# Patient Record
Sex: Male | Born: 1990 | Hispanic: No | Marital: Single | Smoking: Current some day smoker
Health system: Southern US, Community
[De-identification: ages and names within clinical notes are randomized; demographics above are authoritative.]

## PROBLEM LIST (undated history)

## (undated) DIAGNOSIS — R072 Precordial pain: Secondary | ICD-10-CM

## (undated) DIAGNOSIS — I10 Essential (primary) hypertension: Secondary | ICD-10-CM

## (undated) DIAGNOSIS — R002 Palpitations: Secondary | ICD-10-CM

## (undated) DIAGNOSIS — B349 Viral infection, unspecified: Secondary | ICD-10-CM

## (undated) DIAGNOSIS — E119 Type 2 diabetes mellitus without complications: Secondary | ICD-10-CM

## (undated) DIAGNOSIS — E785 Hyperlipidemia, unspecified: Secondary | ICD-10-CM

## (undated) HISTORY — DX: Palpitations: R00.2

## (undated) HISTORY — DX: Hyperlipidemia, unspecified: E78.5

## (undated) HISTORY — DX: Viral infection, unspecified: B34.9

## (undated) HISTORY — DX: Precordial pain: R07.2

## (undated) HISTORY — DX: Type 2 diabetes mellitus without complications: E11.9

---
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
## 2012-02-13 ENCOUNTER — Emergency Department (HOSPITAL_COMMUNITY)
Admission: EM | Admit: 2012-02-13 | Discharge: 2012-02-13 | Disposition: A | Discharge status: Home / Self Care | Payer: Self-pay | Attending: Emergency Medicine | Admitting: Emergency Medicine

## 2012-02-13 ENCOUNTER — Encounter (HOSPITAL_COMMUNITY): Payer: Self-pay | Admitting: *Deleted

## 2012-02-13 DIAGNOSIS — R630 Anorexia: Secondary | ICD-10-CM | POA: Insufficient documentation

## 2012-02-13 DIAGNOSIS — R51 Headache: Secondary | ICD-10-CM | POA: Insufficient documentation

## 2012-02-13 DIAGNOSIS — J3489 Other specified disorders of nose and nasal sinuses: Secondary | ICD-10-CM | POA: Insufficient documentation

## 2012-02-13 DIAGNOSIS — B349 Viral infection, unspecified: Secondary | ICD-10-CM

## 2012-02-13 DIAGNOSIS — B9789 Other viral agents as the cause of diseases classified elsewhere: Secondary | ICD-10-CM | POA: Insufficient documentation

## 2012-02-13 DIAGNOSIS — IMO0001 Reserved for inherently not codable concepts without codable children: Secondary | ICD-10-CM | POA: Insufficient documentation

## 2012-02-13 DIAGNOSIS — J029 Acute pharyngitis, unspecified: Secondary | ICD-10-CM | POA: Insufficient documentation

## 2012-02-13 MED ORDER — IBUPROFEN 800 MG PO TABS
800.0000 mg | ORAL_TABLET | Freq: Once | ORAL | Status: AC
  Administered 2012-02-13: 800 mg via ORAL
  Filled 2012-02-13: qty 1

## 2012-02-13 MED ORDER — IBUPROFEN 800 MG PO TABS: 800.0000 mg | ORAL_TABLET | Freq: Three times a day (TID) | ORAL | Status: DC | PRN

## 2012-02-13 NOTE — ED Notes (Signed)
Pt drinking water at this time.

## 2012-02-13 NOTE — ED Provider Notes (Signed)
History     CSN: [id number]  Arrival date & time 02/13/12  2040   First MD Initiated Contact with Patient 02/13/12 2105      Chief Complaint  Patient presents with  . Influenza    (Consider location/radiation/quality/duration/timing/severity/associated sxs/prior treatment) HPI Comments: Patient states he woke this morning.  Not feeling well complaining of his legs aching, sore throat, and a right knee.  Nose.  This progressed into a tactile fever.  His mother gave him some kind of an over-the-counter.  Multisymptom tablet.  He, states he slept for 4 or 5 hours and woke feeling, just.  He denies any change in his ability to eat, but has no appetite.  Denies any nausea, vomiting, diarrhea, has no known ill contacts  Patient is a 22 y.o. male presenting with flu symptoms. The history is provided by the patient.  Influenza Presenting symptoms: fever, headache, myalgias, rhinorrhea and sore throat   Presenting symptoms: no cough, no diarrhea, no fatigue, no nausea, no shortness of breath and no vomiting   Severity:  Moderate Onset quality:  Gradual Duration:  1 day Progression:  Unchanged Chronicity:  New Relieved by:  Nothing Ineffective treatments:  OTC medications Associated symptoms: decreased appetite and nasal congestion   Associated symptoms: no chills, no ear pain and no neck stiffness     History reviewed. No pertinent past medical history.  History reviewed. No pertinent past surgical history.  No family history on file.  History  Substance Use Topics  . Smoking status: Never Smoker   . Smokeless tobacco: Not on file  . Alcohol Use: No      Review of Systems  Constitutional: Positive for fever and decreased appetite. Negative for chills and fatigue.  HENT: Positive for congestion, sore throat, rhinorrhea and sinus pressure. Negative for ear pain, trouble swallowing and neck stiffness.   Respiratory: Negative for cough and shortness of breath.   Cardiovascular:  Negative.   Gastrointestinal: Negative for nausea, vomiting and diarrhea.  Genitourinary: Negative for dysuria.  Musculoskeletal: Positive for myalgias.  Neurological: Positive for headaches.  All other systems reviewed and are negative.    Allergies  Review of patient's allergies indicates no known allergies.  Home Medications   Current Outpatient Rx  Name  Route  Sig  Dispense  Refill  . ibuprofen (ADVIL,MOTRIN) 800 MG tablet   Oral   Take 1 tablet (800 mg total) by mouth every 8 (eight) hours as needed for pain.   30 tablet   0     BP 135/63  Pulse 110  Temp(Src) 99 F (37.2 C) (Oral)  Resp 20  SpO2 96%  Physical Exam  Constitutional: He is oriented to person, place, and time. He appears well-developed and well-nourished. No distress.  HENT:  Head: Normocephalic and atraumatic.  Right Ear: External ear normal.  Left Ear: External ear normal.  Nose: Nose normal.  Mouth/Throat: Oropharynx is clear and moist. No oropharyngeal exudate.  Eyes: Pupils are equal, round, and reactive to light.  Neck: Normal range of motion.  Cardiovascular: Regular rhythm.  Tachycardia present.   Pulmonary/Chest: Effort normal and breath sounds normal.  Abdominal: Soft. Bowel sounds are normal. He exhibits no distension. There is no tenderness.  Musculoskeletal: Normal range of motion. He exhibits no edema and no tenderness.  Lymphadenopathy:    He has no cervical adenopathy.  Neurological: He is alert and oriented to person, place, and time.  Skin: Skin is warm and dry. No rash noted.  ED Course  Procedures (including critical care time)  Labs Reviewed  RAPID STREP SCREEN   No results found.   1. Viral syndrome       MDM          Arman Filter, NP 02/14/12 2021

## 2012-02-13 NOTE — ED Notes (Signed)
PT reports flu Sx's that started this AM. Pt reports generalize aches ,sore throat and fever. Pt took OTC cold earlier to day.

## 2012-02-15 NOTE — ED Provider Notes (Signed)
Medical screening examination/treatment/procedure(s) were performed by non-physician practitioner and as supervising physician I was immediately available for consultation/collaboration.   Joya Gaskins, MD 02/15/12 1051

## 2019-08-13 ENCOUNTER — Ambulatory Visit (HOSPITAL_COMMUNITY)
Admission: EM | Admit: 2019-08-13 | Discharge: 2019-08-13 | Disposition: A | Discharge status: Home / Self Care | Payer: HRSA Program | Attending: Family Medicine | Admitting: Family Medicine

## 2019-08-13 ENCOUNTER — Other Ambulatory Visit: Payer: Self-pay

## 2019-08-13 ENCOUNTER — Encounter (HOSPITAL_COMMUNITY): Payer: Self-pay

## 2019-08-13 DIAGNOSIS — R002 Palpitations: Secondary | ICD-10-CM | POA: Diagnosis not present

## 2019-08-13 DIAGNOSIS — Z791 Long term (current) use of non-steroidal anti-inflammatories (NSAID): Secondary | ICD-10-CM | POA: Insufficient documentation

## 2019-08-13 DIAGNOSIS — U071 COVID-19: Secondary | ICD-10-CM | POA: Diagnosis not present

## 2019-08-13 DIAGNOSIS — R509 Fever, unspecified: Secondary | ICD-10-CM | POA: Diagnosis present

## 2019-08-13 DIAGNOSIS — B349 Viral infection, unspecified: Secondary | ICD-10-CM

## 2019-08-13 LAB — SARS CORONAVIRUS 2 (TAT 6-24 HRS): SARS Coronavirus 2: POSITIVE — AB

## 2019-08-13 NOTE — ED Triage Notes (Signed)
Pt c/o general malaise, restlessness, subjective fever, "fast heart rate" intermittently, even at rest, decreased appetite, change in taste.  Pt denies CP/bakc/arm/jaw pain, SOB, cough, congestion, runny nose, abdominal pain, n/v/d, dizziness. Took tylenol this morning at 0700 (3 tabs).  Pt states he lost his sense of taste/smell in February and hasn't been able to fully taste/smell since then. Did not have COVID test at that time.

## 2019-08-13 NOTE — Discharge Instructions (Signed)
Rest. Stay hydrated OTC medicines as needed.  Follow up as needed for continued or worsening symptoms Covid test pending.

## 2019-08-13 NOTE — ED Provider Notes (Signed)
[facility]    CSN: [id number] Arrival date & time: 08/13/19  1443      History   Chief Complaint Chief Complaint  Patient presents with  . Fever  . Palpitations    HPI Travis Riggs is a 29 y.o. male.   Patient is a 29 year old male who presents today with generalized malaise, restlessness, subjective fever, decreased appetite and change in taste.  Reporting at times he has had some intermittent palpitations.  No chest pain or shortness of breath.  No cough, congestion, rhinorrhea, abdominal pain, nausea, vomiting or diarrhea.  Took Tylenol this morning for body aches and fever.  Reporting loss of sense of taste and smell in February and unsure if he had Covid at that time.  Has not been COVID vaccinated.  ROS per HPI      History reviewed. No pertinent past medical history.  There are no problems to display for this patient.   History reviewed. No pertinent surgical history.     Home Medications    Prior to Admission medications   Medication Sig Start Date End Date Taking? Authorizing Provider  ibuprofen (ADVIL,MOTRIN) 800 MG tablet Take 1 tablet (800 mg total) by mouth every 8 (eight) hours as needed for pain. 02/13/12   Earley Favor, NP    Family History Family History  Problem Relation Age of Onset  . Diabetes Mother   . Hyperlipidemia Mother     Social History Social History   Tobacco Use  . Smoking status: Never Smoker  . Smokeless tobacco: Never Used  Vaping Use  . Vaping Use: Never used  Substance Use Topics  . Alcohol use: No  . Drug use: Yes    Types: Marijuana     Allergies   Patient has no known allergies.   Review of Systems Review of Systems   Physical Exam Triage Vital Signs ED Triage Vitals  Enc Vitals Group     BP 08/13/19 0912 (!) 140/92     Pulse Rate 08/13/19 0912 76     Resp 08/13/19 0912 17     Temp 08/13/19 0912 98.9 F (37.2 C)     Temp Source 08/13/19 0912 Oral     SpO2 08/13/19 0912 99 %      Weight --      Height --      Head Circumference --      Peak Flow --      Pain Score 08/13/19 0913 0     Pain Loc --      Pain Edu? --      Excl. in GC? --    No data found.  Updated Vital Signs BP (!) 140/92 (BP Location: Left Wrist)   Pulse 76   Temp 98.9 F (37.2 C) (Oral)   Resp 17   SpO2 99%   Visual Acuity Right Eye Distance:   Left Eye Distance:   Bilateral Distance:    Right Eye Near:   Left Eye Near:    Bilateral Near:     Physical Exam Vitals and nursing note reviewed.  Constitutional:      Appearance: Normal appearance.  HENT:     Head: Normocephalic and atraumatic.     Nose: Nose normal.  Eyes:     Conjunctiva/sclera: Conjunctivae normal.  Pulmonary:     Effort: Pulmonary effort is normal.  Musculoskeletal:        General: Normal range of motion.     Cervical back: Normal range of motion.  Skin:    General: Skin is warm and dry.  Neurological:     Mental Status: He is alert.  Psychiatric:        Mood and Affect: Mood normal.      UC Treatments / Results  Labs (all labs ordered are listed, but only abnormal results are displayed) Labs Reviewed  SARS CORONAVIRUS 2 (TAT 6-24 HRS) - Abnormal; Notable for the following components:      Result Value   SARS Coronavirus 2 POSITIVE (*)    All other components within normal limits    EKG   Radiology No results found.  Procedures Procedures (including critical care time)  Medications Ordered in UC Medications - No data to display  Initial Impression / Assessment and Plan / UC Course  I have reviewed the triage vital signs and the nursing notes.  Pertinent labs & imaging results that were available during my care of the patient were reviewed by me and considered in my medical decision making (see chart for details).     Viral illness High possibility for Covid.  Covid swab pending.  Recommended rest, stay hydrated and over-the-counter medicines as needed. Follow up as needed for  continued or worsening symptoms  Final Clinical Impressions(s) / UC Diagnoses   Final diagnoses:  Viral illness     Discharge Instructions     Rest. Stay hydrated OTC medicines as needed.  Follow up as needed for continued or worsening symptoms Covid test pending.     ED Prescriptions    None     PDMP not reviewed this encounter.   Janace Aris, NP 08/14/19 [id number]

## 2020-09-20 ENCOUNTER — Other Ambulatory Visit: Payer: Self-pay

## 2020-09-20 ENCOUNTER — Ambulatory Visit
Admission: EM | Admit: 2020-09-20 | Discharge: 2020-09-20 | Disposition: A | Discharge status: Home / Self Care | Payer: Self-pay | Attending: Emergency Medicine | Admitting: Emergency Medicine

## 2020-09-20 DIAGNOSIS — H60391 Other infective otitis externa, right ear: Secondary | ICD-10-CM

## 2020-09-20 MED ORDER — IBUPROFEN 800 MG PO TABS: 800.0000 mg | ORAL_TABLET | Freq: Three times a day (TID) | ORAL | 0 refills | Status: DC

## 2020-09-20 MED ORDER — AMOXICILLIN 500 MG PO CAPS: 1000.0000 mg | ORAL_CAPSULE | Freq: Two times a day (BID) | ORAL | 0 refills | Status: AC

## 2020-09-20 MED ORDER — NEOMYCIN-POLYMYXIN-HC 3.5-10000-1 OT SUSP: 3.0000 [drp] | Freq: Three times a day (TID) | OTIC | 0 refills | Status: AC

## 2020-09-20 NOTE — ED Triage Notes (Signed)
3 day h/o pulsating right ear pain. Last dose of tylenol taken at 7a today. Has used OTC swimmers ear drops with an increase in sxs. Denies cough.

## 2020-09-20 NOTE — Discharge Instructions (Signed)
Begin amoxicillin twice daily for 10 days Cortisporin eardrops 3 times daily x1 week Ibuprofen and Tylenol for pain Avoid moisture to ear Follow-up if not seeing any improvement with the above over the next 3 days

## 2020-09-20 NOTE — ED Provider Notes (Signed)
[facility]    CSN: [id number] Arrival date & time: 09/20/20  0907      History   Chief Complaint Chief Complaint  Patient presents with   Otalgia    right    HPI Travis Riggs is a 30 y.o. male presenting today for evaluation of right ear pain.  Right ear pain x3 days.  Use OTC swimmer's ear drops without relief.  Denies any recent URI symptoms.  HPI  History reviewed. No pertinent past medical history.  There are no problems to display for this patient.   History reviewed. No pertinent surgical history.     Home Medications    Prior to Admission medications   Medication Sig Start Date End Date Taking? Authorizing Provider  amoxicillin (AMOXIL) 500 MG capsule Take 2 capsules (1,000 mg total) by mouth 2 (two) times daily for 10 days. 09/20/20 09/30/20 Yes Cleona Doubleday C, PA-C  ibuprofen (ADVIL) 800 MG tablet Take 1 tablet (800 mg total) by mouth 3 (three) times daily. 09/20/20  Yes Adiana Smelcer C, PA-C  neomycin-polymyxin-hydrocortisone (CORTISPORIN) 3.5-10000-1 OTIC suspension Place 3 drops into the right ear 3 (three) times daily for 7 days. 09/20/20 09/27/20 Yes Kemar Pandit, Junius Creamer, PA-C    Family History Family History  Problem Relation Age of Onset   Diabetes Mother    Hyperlipidemia Mother     Social History Social History   Tobacco Use   Smoking status: Some Days    Types: Cigars   Smokeless tobacco: Never  Vaping Use   Vaping Use: Never used  Substance Use Topics   Alcohol use: No   Drug use: Yes    Types: Marijuana     Allergies   Patient has no known allergies.   Review of Systems Review of Systems  Constitutional:  Negative for activity change, appetite change, chills, fatigue and fever.  HENT:  Positive for ear pain. Negative for congestion, rhinorrhea, sinus pressure, sore throat and trouble swallowing.   Eyes:  Negative for discharge and redness.  Respiratory:  Negative for cough, chest tightness and shortness of  breath.   Cardiovascular:  Negative for chest pain.  Gastrointestinal:  Negative for abdominal pain, diarrhea, nausea and vomiting.  Musculoskeletal:  Negative for myalgias.  Skin:  Negative for rash.  Neurological:  Negative for dizziness, light-headedness and headaches.    Physical Exam Triage Vital Signs ED Triage Vitals  Enc Vitals Group     BP      Pulse      Resp      Temp      Temp src      SpO2      Weight      Height      Head Circumference      Peak Flow      Pain Score      Pain Loc      Pain Edu?      Excl. in GC?    No data found.  Updated Vital Signs BP 129/85 (BP Location: Left Arm)   Pulse 75   Temp 98.3 F (36.8 C) (Oral)   Resp 18   SpO2 98%   Visual Acuity Right Eye Distance:   Left Eye Distance:   Bilateral Distance:    Right Eye Near:   Left Eye Near:    Bilateral Near:     Physical Exam Vitals and nursing note reviewed.  Constitutional:      Appearance: He is well-developed.  Comments: No acute distress  HENT:     Head: Normocephalic and atraumatic.     Ears:     Comments: Right canal erythematous and edematous, TM partially visualized and appears intact, appears slightly irregular and erythematous    Nose: Nose normal.  Eyes:     Conjunctiva/sclera: Conjunctivae normal.  Cardiovascular:     Rate and Rhythm: Normal rate.  Pulmonary:     Effort: Pulmonary effort is normal. No respiratory distress.  Abdominal:     General: There is no distension.  Musculoskeletal:        General: Normal range of motion.     Cervical back: Neck supple.  Skin:    General: Skin is warm and dry.  Neurological:     Mental Status: He is alert and oriented to person, place, and time.     UC Treatments / Results  Labs (all labs ordered are listed, but only abnormal results are displayed) Labs Reviewed - No data to display  EKG   Radiology No results found.  Procedures Procedures (including critical care time)  Medications Ordered  in UC Medications - No data to display  Initial Impression / Assessment and Plan / UC Course  I have reviewed the triage vital signs and the nursing notes.  Pertinent labs & imaging results that were available during my care of the patient were reviewed by me and considered in my medical decision making (see chart for details).     Otitis externa/media on right-covering with amoxicillin and Cortisporin, anti-inflammatories for pain, monitor for gradual resolution.Discussed strict return precautions. Patient verbalized understanding and is agreeable with plan.  Final Clinical Impressions(s) / UC Diagnoses   Final diagnoses:  Infective otitis externa of right ear     Discharge Instructions      Begin amoxicillin twice daily for 10 days Cortisporin eardrops 3 times daily x1 week Ibuprofen and Tylenol for pain Avoid moisture to ear Follow-up if not seeing any improvement with the above over the next 3 days      ED Prescriptions     Medication Sig Dispense Auth. Provider   amoxicillin (AMOXIL) 500 MG capsule Take 2 capsules (1,000 mg total) by mouth 2 (two) times daily for 10 days. 40 capsule Johnni Wunschel C, PA-C   neomycin-polymyxin-hydrocortisone (CORTISPORIN) 3.5-10000-1 OTIC suspension Place 3 drops into the right ear 3 (three) times daily for 7 days. 10 mL Misaki Sozio C, PA-C   ibuprofen (ADVIL) 800 MG tablet Take 1 tablet (800 mg total) by mouth 3 (three) times daily. 21 tablet Sansa Alkema, [place] C, PA-C      PDMP not reviewed this encounter.   Lew Dawes, PA-C 09/20/20 1007

## 2021-02-02 ENCOUNTER — Emergency Department (HOSPITAL_BASED_OUTPATIENT_CLINIC_OR_DEPARTMENT_OTHER)
Admission: EM | Admit: 2021-02-02 | Discharge: 2021-02-02 | Disposition: A | Discharge status: Home / Self Care | Payer: Self-pay | Attending: Emergency Medicine | Admitting: Emergency Medicine

## 2021-02-02 ENCOUNTER — Emergency Department (HOSPITAL_BASED_OUTPATIENT_CLINIC_OR_DEPARTMENT_OTHER): Payer: Self-pay | Admitting: Radiology

## 2021-02-02 ENCOUNTER — Encounter (HOSPITAL_BASED_OUTPATIENT_CLINIC_OR_DEPARTMENT_OTHER): Payer: Self-pay | Admitting: Emergency Medicine

## 2021-02-02 ENCOUNTER — Other Ambulatory Visit: Payer: Self-pay

## 2021-02-02 DIAGNOSIS — R072 Precordial pain: Secondary | ICD-10-CM | POA: Insufficient documentation

## 2021-02-02 LAB — BASIC METABOLIC PANEL
Anion gap: 7 (ref 5–15)
BUN: 10 mg/dL (ref 6–20)
CO2: 25 mmol/L (ref 22–32)
Calcium: 9.2 mg/dL (ref 8.9–10.3)
Chloride: 105 mmol/L (ref 98–111)
Creatinine, Ser: 0.8 mg/dL (ref 0.61–1.24)
GFR, Estimated: >60 mL/min (ref >60)
Glucose, Bld: 103 mg/dL — ABNORMAL HIGH (ref 70–99)
Potassium: 3.7 mmol/L (ref 3.5–5.1)
Sodium: 137 mmol/L (ref 135–145)

## 2021-02-02 LAB — CBC
HCT: 42.7 % (ref 39.0–52.0)
Hemoglobin: 14.7 g/dL (ref 13.0–17.0)
MCH: 30.1 pg (ref 26.0–34.0)
MCHC: 34.4 g/dL (ref 30.0–36.0)
MCV: 87.3 fL (ref 80.0–100.0)
Platelets: 271 10*3/uL (ref 150–400)
RBC: 4.89 MIL/uL (ref 4.22–5.81)
RDW: 12.6 % (ref 11.5–15.5)
WBC: 8.8 10*3/uL (ref 4.0–10.5)
nRBC: 0 % (ref 0.0–0.2)

## 2021-02-02 LAB — TROPONIN I (HIGH SENSITIVITY): Troponin I (High Sensitivity): 4 ng/L (ref <18)

## 2021-02-02 MED ORDER — IBUPROFEN 600 MG PO TABS: 600.0000 mg | ORAL_TABLET | Freq: Four times a day (QID) | ORAL | 0 refills | Status: DC | PRN

## 2021-02-02 NOTE — Discharge Instructions (Signed)
Please read and follow all provided instructions.  Your diagnoses today include:  1. Precordial pain     Tests performed today include: An EKG of your heart A chest x-ray: Normal-appearing lungs and outline of the heart Cardiac enzymes - a blood test for heart muscle damage, does not show signs of stress on the heart or other problems Blood counts and electrolytes Vital signs. See below for your results today.   Medications prescribed:  Ibuprofen (Motrin, Advil) - anti-inflammatory pain medication Do not exceed 600mg  ibuprofen every 6 hours, take with food  You have been prescribed an anti-inflammatory medication or NSAID. Take with food. Take smallest effective dose for the shortest duration needed for your pain. Stop taking if you experience stomach pain or vomiting.   Take any prescribed medications only as directed.  Follow-up instructions: Please follow-up with your primary care provider as soon as you can for further evaluation of your symptoms.   Return instructions:  SEEK IMMEDIATE MEDICAL ATTENTION IF: You have severe chest pain, especially if the pain is crushing or pressure-like and spreads to the arms, back, neck, or jaw, or if you have sweating, nausea or vomiting, or trouble with breathing. THIS IS AN EMERGENCY. Do not wait to see if the pain will go away. Get medical help at once. Call 911. DO NOT drive yourself to the hospital.  Your chest pain gets worse and does not go away after a few minutes of rest.  You have an attack of chest pain lasting longer than what you usually experience.  You have significant dizziness, if you pass out, or have trouble walking.  You have chest pain not typical of your usual pain for which you originally saw your caregiver.  You have any other emergent concerns regarding your health.  Additional Information: Chest pain comes from many different causes. Your caregiver has diagnosed you as having chest pain that is not specific for one  problem, but does not require admission.  You are at low risk for an acute heart condition or other serious illness.   Your vital signs today were: BP 132/90    Pulse 62    Temp 99.1 F (37.3 C) (Temporal)    Resp 16    Ht 6' (1.829 m)    Wt (!) 145.2 kg    SpO2 100%    BMI 43.40 kg/m  If your blood pressure (BP) was elevated above 135/85 this visit, please have this repeated by your doctor within one month. --------------

## 2021-02-02 NOTE — ED Provider Notes (Signed)
[place] EMERGENCY DEPT Provider Note   CSN: [id number] Arrival date & time: 02/02/21  1209     History  Chief Complaint  Patient presents with   Chest Pain    Travis Riggs is a 31 y.o. male.  Patient presents to the emergency department for evaluation of left sided chest pain that has been ongoing for 4 days.  Patient describes the pain as a "soreness".  It does not radiate.  It is not worse with movement.  He feels that little bit more with deep breathing but denies cough, shortness of breath, fever.  He works in [employer] but denies any obvious injuries.  No nausea, vomiting, diarrhea.  Pain is constant.  Not associated with exertion.  No associated diaphoresis.  Denies history of hypertension, high cholesterol, diabetes but some of these run in his family.  No history of heart problems. Patient denies risk factors for pulmonary embolism including: unilateral leg swelling, history of DVT/PE/other blood clots, use of exogenous hormones, recent immobilizations, recent surgery, recent travel (>4hr segment), malignancy, hemoptysis.  Patient does endorse injecting a combination of B vitamins that he receives from [place].  He does this to help his energy level.      Home Medications Prior to Admission medications   Medication Sig Start Date End Date Taking? Authorizing Provider  ibuprofen (ADVIL) 800 MG tablet Take 1 tablet (800 mg total) by mouth 3 (three) times daily. 09/20/20   Wieters, Hallie C, PA-C      Allergies    Patient has no known allergies.    Review of Systems   Review of Systems  Physical Exam Updated Vital Signs BP 129/74    Pulse 66    Temp 99.1 F (37.3 C) (Temporal)    Resp 20    Ht 6' (1.829 m)    Wt (!) 145.2 kg    SpO2 99%    BMI 43.40 kg/m  Physical Exam Vitals and nursing note reviewed.  Constitutional:      Appearance: He is well-developed. He is not diaphoretic.  HENT:     Head: Normocephalic and atraumatic.     Mouth/Throat:      Mouth: Mucous membranes are not dry.  Eyes:     Conjunctiva/sclera: Conjunctivae normal.  Neck:     Vascular: Normal carotid pulses. No carotid bruit or JVD.     Trachea: Trachea normal. No tracheal deviation.  Cardiovascular:     Rate and Rhythm: Normal rate and regular rhythm.     Pulses: No decreased pulses.          Radial pulses are 2+ on the right side and 2+ on the left side.     Heart sounds: Normal heart sounds, S1 normal and S2 normal. Heart sounds not distant. No murmur heard. Pulmonary:     Effort: Pulmonary effort is normal. No respiratory distress.     Breath sounds: Normal breath sounds. No wheezing.  Chest:     Chest wall: No tenderness.     Comments: No tenderness to palpation over the left chest.  No skin findings. Abdominal:     General: Bowel sounds are normal.     Palpations: Abdomen is soft.     Tenderness: There is no abdominal tenderness. There is no guarding or rebound.  Musculoskeletal:     Cervical back: Normal range of motion and neck supple. No muscular tenderness.     Right lower leg: No edema.     Left lower leg: No edema.  Skin:    General: Skin is warm and dry.     Coloration: Skin is not pale.  Neurological:     Mental Status: He is alert. Mental status is at baseline.  Psychiatric:        Mood and Affect: Mood normal.    ED Results / Procedures / Treatments   Labs (all labs ordered are listed, but only abnormal results are displayed) Labs Reviewed  BASIC METABOLIC PANEL - Abnormal; Notable for the following components:      Result Value   Glucose, Bld 103 (*)    All other components within normal limits  CBC  TROPONIN I (HIGH SENSITIVITY)  TROPONIN I (HIGH SENSITIVITY)    ED ECG REPORT   Date: 02/02/2021  Rate: 66  Rhythm: normal sinus rhythm  QRS Axis: normal  Intervals: normal  ST/T Wave abnormalities: normal  Conduction Disutrbances:none  Narrative Interpretation:   Old EKG Reviewed: none available  I have personally  reviewed the EKG tracing and agree with the computerized printout as noted.   Radiology DG Chest 2 View  Result Date: 02/02/2021 CLINICAL DATA:  Chest pain EXAM: CHEST - 2 VIEW COMPARISON:  None. FINDINGS: The heart size and mediastinal contours are within normal limits. Both lungs are clear. The visualized skeletal structures are unremarkable. IMPRESSION: No active cardiopulmonary disease. Electronically Signed   By: Keane Police D.O.   On: 02/02/2021 12:50    Procedures Procedures    Medications Ordered in ED Medications - No data to display  ED Course/ Medical Decision Making/ A&P    Patient seen and examined. History obtained directly from patient. Work-up including labs, imaging, EKG ordered in triage, if performed, were reviewed.    Labs/EKG: Independently reviewed and interpreted.  This included: EKG showing normal sinus rhythm without ischemic findings.  CBC, BMP, troponin x1 pending.  Imaging: Independently reviewed and interpreted.  This included: Chest x-ray, agree normal.  Medications/Fluids: Ordered: None   Most recent vital signs reviewed and are as follows: BP 129/74    Pulse 66    Temp 99.1 F (37.3 C) (Temporal)    Resp 20    Ht 6' (1.829 m)    Wt (!) 145.2 kg    SpO2 99%    BMI 43.40 kg/m   Initial impression: Nonspecific chest pain, PERC negative.   3:45 PM Reassessment performed. Patient appears comfortable.   Labs and imaging personally reviewed and interpreted including: Normal troponin, CBC without abnormality, BMP with only mildly elevated glucose.  Reviewed additional pertinent lab work and imaging with patient at bedside including: Reassuring chest x-ray, EKG, labs  Most current vital signs reviewed and are as follows: BP 132/90    Pulse 62    Temp 99.1 F (37.3 C) (Temporal)    Resp 16    Ht 6' (1.829 m)    Wt (!) 145.2 kg    SpO2 100%    BMI 43.40 kg/m   Plan: Discharge to home  Home treatment: Prescription written for ibuprofen.   Return  and follow-up instructions: I encouraged patient to return to ED with severe chest pain, especially if the pain is crushing or pressure-like and spreads to the arms, back, neck, or jaw, or if they have associated sweating, vomiting, or shortness of breath with the pain, or significant pain with activity. We discussed that the evaluation here today indicates a low-risk of serious cause of chest pain, including heart trouble or a blood clot, but no evaluation is perfect and  chest pain can evolve with time. The patient verbalized understanding and agreed.  I encouraged patient to follow-up with their provider in the next 48 hours for recheck.    Encouraged patient to follow-up with their provider as needed. Patient verbalized understanding and agreed with plan.                                 Medical Decision Making Amount and/or Complexity of Data Reviewed Labs: ordered. Radiology: ordered.  Risk Prescription drug management.   For this patient's complaint of chest pain, the following emergent conditions were considered on the differential diagnosis: acute coronary syndrome, pulmonary embolism, pneumothorax, myocarditis, pericardial tamponade, aortic dissection, thoracic aortic aneurysm complication, esophageal perforation.   Other causes were also considered including: gastroesophageal reflux disease, musculoskeletal pain including costochondritis, pneumonia/pleurisy, herpes zoster, pericarditis.  In regards to possibility of ACS, patient has atypical features of pain, non-ischemic and unchanged EKG and negative troponin(s). Heart score was calculated to be 1.   In regards to possibility of PE, symptoms are atypical for PE and patient is PERC negative and chance of PE < 2%.   The patient's vital signs, pertinent lab work and imaging were reviewed and interpreted as discussed in the ED course. Hospitalization was considered for further testing, treatments, or serial exams/observation.  However as patient is well-appearing, has a stable exam, and reassuring studies today, I do not feel that they warrant admission at this time. This plan was discussed with the patient who verbalizes agreement and comfort with this plan and seems reliable and able to return to the Emergency Department with worsening or changing symptoms.          Final Clinical Impression(s) / ED Diagnoses Final diagnoses:  Precordial pain    Rx / DC Orders ED Discharge Orders          Ordered    ibuprofen (ADVIL) 600 MG tablet  Every 6 hours PRN        02/02/21 1544              Carlisle Cater, PA-C 02/02/21 1547    Luna Fuse, MD 02/04/21 [phone]

## 2021-02-02 NOTE — ED Triage Notes (Addendum)
Pt arrives to ED with c/o chest pain. The pain is left sided and started x5 days. The CP is constant and is described as soreness. Pt reports he does inject vitamins every week that he gets from [place], last injection was last Sunday.

## 2021-02-02 NOTE — ED Notes (Signed)
Patient verbalizes understanding of discharge instructions. Opportunity for questioning and answers were provided. Patient discharged from ED.  °

## 2021-10-27 ENCOUNTER — Other Ambulatory Visit: Payer: Self-pay

## 2021-10-27 ENCOUNTER — Ambulatory Visit
Admission: EM | Admit: 2021-10-27 | Discharge: 2021-10-27 | Disposition: A | Discharge status: Home / Self Care | Payer: Self-pay | Attending: Urgent Care | Admitting: Urgent Care

## 2021-10-27 DIAGNOSIS — R002 Palpitations: Secondary | ICD-10-CM

## 2021-10-27 DIAGNOSIS — R519 Headache, unspecified: Secondary | ICD-10-CM

## 2021-10-27 DIAGNOSIS — R0602 Shortness of breath: Secondary | ICD-10-CM

## 2021-10-27 DIAGNOSIS — J309 Allergic rhinitis, unspecified: Secondary | ICD-10-CM

## 2021-10-27 DIAGNOSIS — Z8249 Family history of ischemic heart disease and other diseases of the circulatory system: Secondary | ICD-10-CM

## 2021-10-27 DIAGNOSIS — I10 Essential (primary) hypertension: Secondary | ICD-10-CM

## 2021-10-27 MED ORDER — LISINOPRIL 10 MG PO TABS: 10.0000 mg | ORAL_TABLET | Freq: Every day | ORAL | 0 refills | Status: DC

## 2021-10-27 MED ORDER — CETIRIZINE HCL 10 MG PO TABS: 10.0000 mg | ORAL_TABLET | Freq: Every day | ORAL | 0 refills | Status: DC

## 2021-10-27 MED ORDER — FLUTICASONE PROPIONATE 50 MCG/ACT NA SUSP: 2.0000 | Freq: Every day | NASAL | 12 refills | Status: DC

## 2021-10-27 MED ORDER — PSEUDOEPHEDRINE HCL 30 MG PO TABS: 30.0000 mg | ORAL_TABLET | Freq: Three times a day (TID) | ORAL | 0 refills | Status: DC | PRN

## 2021-10-27 NOTE — ED Triage Notes (Signed)
Patient presents to UC for episodes of HA, hot flashes, heart palpitations x 2 weeks. He states HA is constant and heart palpitations last about 5-10 mins. Has noted episodes of exertion and SOB. Also reports feeling some tremors and some changes in vision when he has these episodes. Does not have a PCP. Concerned it may be related to HTN. Family hx of diabetes and HTN. Treating pain with tylenol.   Denies chest pain.

## 2021-10-27 NOTE — ED Provider Notes (Signed)
[facility]  Note:  This document was prepared using Conservation officer, historic buildings and may include unintentional dictation errors.  MRN: [id number] DOB: 07-18-1990  Subjective:   Travis Riggs is a 31 y.o. male presenting for 2 week history of persistent heart palpitations, hot flashes, heart racing.  Has multiple episodes a day and occurs with 5 to 10 minute episodes.  He occasionally has shortness of breath with exertion.  Has also had temporal headaches, right ear itching.  Wakes up every morning as long as he can remember with congestion.  Works in [employer].  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  No history of respiratory disorders.  Has concerns about high blood pressure, has extensive family history of the same.  No current facility-administered medications for this encounter.  Current Outpatient Medications:    ibuprofen (ADVIL) 600 MG tablet, Take 1 tablet (600 mg total) by mouth every 6 (six) hours as needed., Disp: 20 tablet, Rfl: 0   No Known Allergies  No past medical history on file.   No past surgical history on file.  Family History  Problem Relation Age of Onset   Diabetes Mother    Hyperlipidemia Mother     Social History   Tobacco Use   Smoking status: Some Days    Types: Cigars   Smokeless tobacco: Never  Vaping Use   Vaping Use: Never used  Substance Use Topics   Alcohol use: Yes    Comment: once a month   Drug use: Yes    Types: Marijuana    ROS   Objective:   Vitals: BP (!) 146/89 (BP Location: Left Arm)   Pulse 69   Temp 98.4 F (36.9 C) (Oral)   Resp 16   SpO2 96%   BP Readings from Last 3 Encounters:  10/27/21 (!) 146/89  02/02/21 132/90  09/20/20 129/85   Physical Exam Constitutional:      General: He is not in acute distress.    Appearance: Normal appearance. He is well-developed. He is not ill-appearing, toxic-appearing or diaphoretic.  HENT:     Head: Normocephalic and atraumatic.      Right Ear: External ear normal.     Left Ear: External ear normal.     Nose: Nose normal.     Mouth/Throat:     Mouth: Mucous membranes are moist.  Eyes:     General: No scleral icterus.       Right eye: No discharge.        Left eye: No discharge.     Extraocular Movements: Extraocular movements intact.     Pupils: Pupils are equal, round, and reactive to light.  Cardiovascular:     Rate and Rhythm: Normal rate and regular rhythm.     Heart sounds: Normal heart sounds. No murmur heard.    No friction rub. No gallop.  Pulmonary:     Effort: Pulmonary effort is normal. No respiratory distress.     Breath sounds: Normal breath sounds. No stridor. No wheezing, rhonchi or rales.  Neurological:     Mental Status: He is alert and oriented to person, place, and time.     Cranial Nerves: No cranial nerve deficit, dysarthria or facial asymmetry.     Motor: No weakness.     Coordination: Romberg sign negative. Coordination normal.     Gait: Gait normal.     Deep Tendon Reflexes: Reflexes normal.  Psychiatric:        Mood and  Affect: Mood normal.        Speech: Speech normal.        Behavior: Behavior normal.        Thought Content: Thought content normal.    ED ECG REPORT   Date: 10/27/2021  EKG Time: 11:02 AM  Rate: 65bpm  Rhythm: normal sinus rhythm,  normal EKG, normal sinus rhythm, unchanged from previous tracings  Axis: normal  Intervals:none  ST&T Change: none  Narrative Interpretation: sinus rhythm at 65bpm without any acute findings. No acute changes.   Assessment and Plan :   PDMP not reviewed this encounter.  1. Palpitations   2. Shortness of breath   3. Sinus headache   4. Allergic rhinitis, unspecified seasonality, unspecified trigger   5. Essential hypertension   6. Family history of hypertension     Given cardiac symptoms recommended further work up and consultation through cardiology. Patient had concerns about his high blood pressure and would like to  start a medication. I offered lisinopril low dose at 10mg  and emphasized need to establish care and follow up with a PCP. Provided him with information to do so. No signs of ACS on ekg. Low PERC score, there no suspicion for PE. Patient has clear lung sounds and blood pressure is minimally elevated, therefore low suspicion for acute chf. Deferred chest x-ray for now. Recommended starting medication regimen for allergic rhinitis. Suspect this is the primary source of his symptoms including sinus headaches, upper respiratory symptoms, work in [employer]. No signs of stroke, acute encephalopathy. Counseled patient on potential for adverse effects with medications prescribed/recommended today, ER and return-to-clinic precautions discussed, patient verbalized understanding.    Jaynee Eagles, PA-C 10/27/21 1352

## 2021-10-29 ENCOUNTER — Ambulatory Visit
Admission: RE | Admit: 2021-10-29 | Discharge: 2021-10-29 | Disposition: A | Discharge status: Home / Self Care | Payer: Self-pay | Source: Ambulatory Visit | Attending: Emergency Medicine | Admitting: Emergency Medicine

## 2021-10-29 VITALS — BP 119/84 | HR 115 | Temp 97.9°F | Resp 18

## 2021-10-29 DIAGNOSIS — B350 Tinea barbae and tinea capitis: Secondary | ICD-10-CM

## 2021-10-29 MED ORDER — TERBINAFINE HCL 250 MG PO TABS: 250.0000 mg | ORAL_TABLET | Freq: Every day | ORAL | 0 refills | Status: DC

## 2021-10-29 NOTE — ED Triage Notes (Signed)
The patient states he has a lump to the back of his head (right side) that he noticed on Tuesday.    Home interventions: tylenol

## 2021-10-29 NOTE — Discharge Instructions (Signed)
Please begin terbinafine 1 tablet daily for the next 6 weeks.  You may take this medication with or without meals.  You can take this medication along with the other medications that you are currently taking.  Please follow-up with primary care if you have not had meaningful improvement of your symptoms after 6 weeks of medication.  Thank you for visiting urgent care today.

## 2021-10-29 NOTE — ED Provider Notes (Signed)
[facility]    CSN: [id number] Arrival date & time: 10/29/21  1742    HISTORY   Chief Complaint  Patient presents with   Abscess   HPI Travis Riggs is a pleasant, 31 y.o. male who presents to urgent care today. Patient complains of a lump on the back of his head at the base that he noticed 2 days ago.  Patient states has been taking Tylenol without relief.  Patient states he is never had this before.  States that he can feel the lump and it is little bit tender when he presses on it but otherwise does not itch or hurt.  Patient denies drainage from the lump.  The history is provided by the patient.   History reviewed. No pertinent past medical history. There are no problems to display for this patient.  History reviewed. No pertinent surgical history.  Home Medications    Prior to Admission medications   Medication Sig Start Date End Date Taking? Authorizing Provider  terbinafine (LAMISIL) 250 MG tablet Take 1 tablet (250 mg total) by mouth daily. 10/29/21 12/10/21 Yes Theadora Rama Scales, PA-C  cetirizine (ZYRTEC ALLERGY) 10 MG tablet Take 1 tablet (10 mg total) by mouth daily. 10/27/21   Wallis Bamberg, PA-C  fluticasone (FLONASE) 50 MCG/ACT nasal spray Place 2 sprays into both nostrils daily. 10/27/21   Wallis Bamberg, PA-C  ibuprofen (ADVIL) 600 MG tablet Take 1 tablet (600 mg total) by mouth every 6 (six) hours as needed. 02/02/21   Renne Crigler, PA-C  lisinopril (ZESTRIL) 10 MG tablet Take 1 tablet (10 mg total) by mouth daily. 10/27/21   Wallis Bamberg, PA-C  pseudoephedrine (SUDAFED) 30 MG tablet Take 1 tablet (30 mg total) by mouth every 8 (eight) hours as needed for congestion. 10/27/21   Wallis Bamberg, PA-C    Family History Family History  Problem Relation Age of Onset   Diabetes Mother    Hyperlipidemia Mother    Social History Social History   Tobacco Use   Smoking status: Some Days    Types: Cigars   Smokeless tobacco: Never  Vaping Use   Vaping  Use: Never used  Substance Use Topics   Alcohol use: Yes    Comment: once a month   Drug use: Yes    Types: Marijuana   Allergies   Patient has no known allergies.  Review of Systems Review of Systems Pertinent findings revealed after performing a 14 point review of systems has been noted in the history of present illness.  Physical Exam Triage Vital Signs ED Triage Vitals  Enc Vitals Group     BP 10/31/20 0827 (!) 147/82     Pulse Rate 10/31/20 0827 72     Resp 10/31/20 0827 18     Temp 10/31/20 0827 98.3 F (36.8 C)     Temp Source 10/31/20 0827 Oral     SpO2 10/31/20 0827 98 %     Weight --      Height --      Head Circumference --      Peak Flow --      Pain Score 10/31/20 0826 5     Pain Loc --      Pain Edu? --      Excl. in GC? --   No data found.  Updated Vital Signs BP 119/84 (BP Location: Right Arm)   Pulse (!) 115   Temp 97.9 F (36.6 C) (Oral)   Resp 18   SpO2 96%  Physical Exam Vitals and nursing note reviewed.  Constitutional:      General: He is not in acute distress.    Appearance: Normal appearance. He is normal weight. He is not ill-appearing.  HENT:     Head: Normocephalic and atraumatic.  Eyes:     Extraocular Movements: Extraocular movements intact.     Conjunctiva/sclera: Conjunctivae normal.     Pupils: Pupils are equal, round, and reactive to light.  Cardiovascular:     Rate and Rhythm: Normal rate and regular rhythm.  Pulmonary:     Effort: Pulmonary effort is normal.     Breath sounds: Normal breath sounds.  Musculoskeletal:        General: Normal range of motion.     Cervical back: Normal range of motion and neck supple.  Skin:    General: Skin is warm and dry.     Findings: Rash (Multiple erythematou, slightly indurated, slightly boggy lesions scattered across the back of patient's head) present.  Neurological:     General: No focal deficit present.     Mental Status: He is alert and oriented to person, place, and time.  Mental status is at baseline.  Psychiatric:        Mood and Affect: Mood normal.        Behavior: Behavior normal.        Thought Content: Thought content normal.        Judgment: Judgment normal.     Visual Acuity Right Eye Distance:   Left Eye Distance:   Bilateral Distance:    Right Eye Near:   Left Eye Near:    Bilateral Near:     UC Couse / Diagnostics / Procedures:     Radiology No results found.  Procedures Procedures (including critical care time) EKG  Pending results:  Labs Reviewed - No data to display  Medications Ordered in UC: Medications - No data to display  UC Diagnoses / Final Clinical Impressions(s)   I have reviewed the triage vital signs and the nursing notes.  Pertinent labs & imaging results that were available during my care of the patient were reviewed by me and considered in my medical decision making (see chart for details).    Final diagnoses:  Tinea capitis/barbae  Tinea kerion   Patient provided with a prescription for a 6-week course of terbinafine 250 mg daily for presumed tinea capitis.  Patient advised to follow-up with primary care or dermatology if not resolved after treatment. ED Prescriptions     Medication Sig Dispense Auth. Provider   terbinafine (LAMISIL) 250 MG tablet Take 1 tablet (250 mg total) by mouth daily. 42 tablet Theadora Rama Scales, PA-C      PDMP not reviewed this encounter.  Pending results:  Labs Reviewed - No data to display  Discharge Instructions:   Discharge Instructions      Please begin terbinafine 1 tablet daily for the next 6 weeks.  You may take this medication with or without meals.  You can take this medication along with the other medications that you are currently taking.  Please follow-up with primary care if you have not had meaningful improvement of your symptoms after 6 weeks of medication.  Thank you for visiting urgent care today.    Disposition Upon Discharge:  Condition:  stable for discharge home  Patient presented with an acute illness with associated systemic symptoms and significant discomfort requiring urgent management. In my opinion, this is a condition that a prudent lay person (  someone who possesses an average knowledge of health and medicine) may potentially expect to result in complications if not addressed urgently such as respiratory distress, impairment of bodily function or dysfunction of bodily organs.   Routine symptom specific, illness specific and/or disease specific instructions were discussed with the patient and/or caregiver at length.   As such, the patient has been evaluated and assessed, work-up was performed and treatment was provided in alignment with urgent care protocols and evidence based medicine.  Patient/parent/caregiver has been advised that the patient may require follow up for further testing and treatment if the symptoms continue in spite of treatment, as clinically indicated and appropriate.  Patient/parent/caregiver has been advised to return to the [facility] or PCP if no better; to PCP or the Emergency Department if new signs and symptoms develop, or if the current signs or symptoms continue to change or worsen for further workup, evaluation and treatment as clinically indicated and appropriate  The patient will follow up with their current PCP if and as advised. If the patient does not currently have a PCP we will assist them in obtaining one.   The patient may need specialty follow up if the symptoms continue, in spite of conservative treatment and management, for further workup, evaluation, consultation and treatment as clinically indicated and appropriate.   Patient/parent/caregiver verbalized understanding and agreement of plan as discussed.  All questions were addressed during visit.  Please see discharge instructions below for further details of plan.  This office note has been dictated using Health visitor.  Unfortunately, this method of dictation can sometimes lead to typographical or grammatical errors.  I apologize for your inconvenience in advance if this occurs.  Please do not hesitate to reach out to me if clarification is needed.      Lynden Oxford Scales, PA-C 10/29/21 1904

## 2021-11-04 ENCOUNTER — Other Ambulatory Visit: Payer: Self-pay

## 2021-11-04 ENCOUNTER — Emergency Department (HOSPITAL_BASED_OUTPATIENT_CLINIC_OR_DEPARTMENT_OTHER)
Admission: EM | Admit: 2021-11-04 | Discharge: 2021-11-04 | Disposition: A | Discharge status: Home / Self Care | Payer: Self-pay | Attending: Emergency Medicine | Admitting: Emergency Medicine

## 2021-11-04 ENCOUNTER — Encounter (HOSPITAL_BASED_OUTPATIENT_CLINIC_OR_DEPARTMENT_OTHER): Payer: Self-pay | Admitting: Pediatrics

## 2021-11-04 DIAGNOSIS — B029 Zoster without complications: Secondary | ICD-10-CM | POA: Insufficient documentation

## 2021-11-04 DIAGNOSIS — Z79899 Other long term (current) drug therapy: Secondary | ICD-10-CM | POA: Insufficient documentation

## 2021-11-04 DIAGNOSIS — I10 Essential (primary) hypertension: Secondary | ICD-10-CM | POA: Insufficient documentation

## 2021-11-04 HISTORY — DX: Essential (primary) hypertension: I10

## 2021-11-04 LAB — COMPREHENSIVE METABOLIC PANEL
ALT: 88 U/L — ABNORMAL HIGH (ref 0–44)
AST: 43 U/L — ABNORMAL HIGH (ref 15–41)
Albumin: 4.6 g/dL (ref 3.5–5.0)
Alkaline Phosphatase: 92 U/L (ref 38–126)
Anion gap: 10 (ref 5–15)
BUN: 13 mg/dL (ref 6–20)
CO2: 25 mmol/L (ref 22–32)
Calcium: 9.6 mg/dL (ref 8.9–10.3)
Chloride: 103 mmol/L (ref 98–111)
Creatinine, Ser: 0.79 mg/dL (ref 0.61–1.24)
GFR, Estimated: >60 mL/min (ref >60)
Glucose, Bld: 115 mg/dL — ABNORMAL HIGH (ref 70–99)
Potassium: 3.9 mmol/L (ref 3.5–5.1)
Sodium: 138 mmol/L (ref 135–145)
Total Bilirubin: 0.5 mg/dL (ref 0.3–1.2)
Total Protein: 8 g/dL (ref 6.5–8.1)

## 2021-11-04 MED ORDER — VALACYCLOVIR HCL 1 G PO TABS: 1000.0000 mg | ORAL_TABLET | Freq: Three times a day (TID) | ORAL | 0 refills | Status: DC

## 2021-11-04 MED ORDER — GABAPENTIN 100 MG PO CAPS: 100.0000 mg | ORAL_CAPSULE | Freq: Three times a day (TID) | ORAL | 0 refills | Status: DC | PRN

## 2021-11-04 NOTE — Discharge Instructions (Addendum)
Your blood pressure here is looking normal and you can continue to hold the blood pressure medicine until you follow-up with your doctor on the 14th.  It also appears that you have shingles so you can stop taking the fungal medication you were given earlier.  You were given a prescription for viral medication and gabapentin which is for the nerve pain.  You can take that medicine 3 times a day if you need to or you can try just 1 time a day.  You will have to see what helps the pain for you.  However this medicine can make you tired so do not take it and drive.

## 2021-11-04 NOTE — ED Triage Notes (Addendum)
Reported recently seen at an UC;  and was treated for fungal infection on the back of the head and high blood pressure; c/o headache worst at night, states feels like a pressure pushing behind right eye. Takes motrin and tylenol at home;   Reports the high blood pressure is  a new diagnosis and the prescribed lisinopril makes him feel tired.

## 2021-11-04 NOTE — ED Provider Notes (Signed)
[facility] DEPT Provider Note   CSN: [id number] Arrival date & time: 11/04/21  1093     History  Chief Complaint  Patient presents with   Headache    Travis Riggs is a 31 y.o. male.  Patient is a 31 year old male with no significant medical history who is presenting today complaining of ongoing pain to his head and headache.  Patient reports that he noticed a painful rash on his head 6 days ago and went to urgent care on 10/27/2021 and 10/29/2021 with headache and then the rash on his head.  He was diagnosed with hypertension and tinea.  He was given lisinopril and Lamisil.  He reports he did try to take the lisinopril but it made him feel very tired and he just wanted to sleep all the time so he stopped taking that 2 days ago.  He reports that also the Lamisil has not helped at all with his rash and it continues to be painful and is not getting any better.  He also feels like it is gotten a little bit worse.  He reports the most of the headache is the scalp pain he is experiencing but he does feel some pressure behind his eye.  He denies any numbness, weakness, visual abnormalities.  No speech changes.  He reports no prior history of hypertension and last had his blood pressure checked before this episode in January and it was normal.  He denies any fever, chest pain or shortness of breath.  He does not wear a hat or any area that rubs on his scalp.  The history is provided by the patient.  Headache      Home Medications Prior to Admission medications   Medication Sig Start Date End Date Taking? Authorizing Provider  gabapentin (NEURONTIN) 100 MG capsule Take 1 capsule (100 mg total) by mouth 3 (three) times daily as needed (for pain). 11/04/21  Yes Randal Goens, Loree Fee, MD  valACYclovir (VALTREX) 1000 MG tablet Take 1 tablet (1,000 mg total) by mouth 3 (three) times daily. 11/04/21  Yes Blanchie Dessert, MD  cetirizine (ZYRTEC ALLERGY) 10 MG tablet Take 1 tablet  (10 mg total) by mouth daily. 10/27/21   Jaynee Eagles, PA-C  fluticasone (FLONASE) 50 MCG/ACT nasal spray Place 2 sprays into both nostrils daily. 10/27/21   Jaynee Eagles, PA-C  ibuprofen (ADVIL) 600 MG tablet Take 1 tablet (600 mg total) by mouth every 6 (six) hours as needed. 02/02/21   Carlisle Cater, PA-C  lisinopril (ZESTRIL) 10 MG tablet Take 1 tablet (10 mg total) by mouth daily. 10/27/21   Jaynee Eagles, PA-C  pseudoephedrine (SUDAFED) 30 MG tablet Take 1 tablet (30 mg total) by mouth every 8 (eight) hours as needed for congestion. 10/27/21   Jaynee Eagles, PA-C  terbinafine (LAMISIL) 250 MG tablet Take 1 tablet (250 mg total) by mouth daily. 10/29/21 12/10/21  Lynden Oxford Scales, PA-C      Allergies    Patient has no known allergies.    Review of Systems   Review of Systems  Neurological:  Positive for headaches.    Physical Exam Updated Vital Signs BP 133/73 (BP Location: Left Arm)   Pulse 79   Temp 98.2 F (36.8 C) (Oral)   Resp 18   Ht 6' (1.829 m)   Wt (!) 142.9 kg   SpO2 99%   BMI 42.72 kg/m  Physical Exam Vitals and nursing note reviewed.  Constitutional:      General: He is not in acute  distress.    Appearance: He is well-developed.  HENT:     Head: Normocephalic and atraumatic.      Comments: Mildly tender posterior lymph nodes Eyes:     Extraocular Movements: Extraocular movements intact.     Conjunctiva/sclera: Conjunctivae normal.     Pupils: Pupils are equal, round, and reactive to light.  Cardiovascular:     Rate and Rhythm: Normal rate and regular rhythm.     Heart sounds: No murmur heard. Pulmonary:     Effort: Pulmonary effort is normal. No respiratory distress.     Breath sounds: Normal breath sounds. No wheezing or rales.  Abdominal:     General: There is no distension.     Palpations: Abdomen is soft.     Tenderness: There is no abdominal tenderness. There is no guarding or rebound.  Musculoskeletal:        General: No tenderness. Normal  range of motion.     Cervical back: Normal range of motion and neck supple.  Skin:    General: Skin is warm and dry.     Findings: No erythema or rash.  Neurological:     Mental Status: He is alert and oriented to person, place, and time. Mental status is at baseline.     Cranial Nerves: No cranial nerve deficit.     Sensory: No sensory deficit.     Motor: No weakness.     Coordination: Coordination normal.     Gait: Gait normal.  Psychiatric:        Behavior: Behavior normal.     ED Results / Procedures / Treatments   Labs (all labs ordered are listed, but only abnormal results are displayed) Labs Reviewed  COMPREHENSIVE METABOLIC PANEL - Abnormal; Notable for the following components:      Result Value   Glucose, Bld 115 (*)    AST 43 (*)    ALT 88 (*)    All other components within normal limits    EKG None  Radiology No results found.  Procedures Procedures    Medications Ordered in ED Medications - No data to display  ED Course/ Medical Decision Making/ A&P                           Medical Decision Making Amount and/or Complexity of Data Reviewed Labs: ordered. Decision-making details documented in ED Course.  Risk Prescription drug management.   Patient presenting today with a painful rash on the back of his scalp which looks most consistent with shingles.  Symptoms have now been present for 6 days.  Patient was diagnosed with hypertension and was started on lisinopril however he reports the medicine made him feel very tired and he discontinued it 2 days ago.  Patient's blood pressure here is 136/86 and at this time do not feel that he needs to continue the blood pressure medication but can follow the blood pressure and ensure that it does not worsen.  He has no neurologic findings today concerning for stroke.  Vision is intact.  Rash on his head does not look like a kerion or abscess.  Will need to start Valtrex in gabapentin for pain.  We will check a CMP  to ensure normal renal function and also LFTs as he has been on Lamisil for initially a fungal infection of his scalp.  We will discontinue the Lamisil today.  10:07 AM I independently interpreted patient's labs and BMP shows normal renal function  and electrolytes but he does have mildly elevated LFTs.  We will stop the Lamisil and he has an appointment with his PCP on the 14th and they can be rechecked at that time.  Patient will be discharged with Valtrex and gabapentin.        Final Clinical Impression(s) / ED Diagnoses Final diagnoses:  Herpes zoster without complication    Rx / DC Orders ED Discharge Orders          Ordered    valACYclovir (VALTREX) 1000 MG tablet  3 times daily        11/04/21 1007    gabapentin (NEURONTIN) 100 MG capsule  3 times daily PRN        11/04/21 1007              Gwyneth Sprout, MD 11/04/21 1007

## 2021-11-24 ENCOUNTER — Ambulatory Visit: Payer: Self-pay | Attending: Cardiovascular Disease | Admitting: Cardiovascular Disease

## 2021-11-24 ENCOUNTER — Telehealth: Payer: Self-pay | Admitting: *Deleted

## 2021-11-24 ENCOUNTER — Encounter: Payer: Self-pay | Admitting: Cardiovascular Disease

## 2021-11-24 VITALS — BP 124/76 | HR 83 | Ht 72.0 in | Wt 336.0 lb

## 2021-11-24 DIAGNOSIS — R0789 Other chest pain: Secondary | ICD-10-CM

## 2021-11-24 DIAGNOSIS — G4733 Obstructive sleep apnea (adult) (pediatric): Secondary | ICD-10-CM

## 2021-11-24 NOTE — Telephone Encounter (Signed)
Pt has returned the device as to no insurance and the cost of the device.

## 2021-11-24 NOTE — Patient Instructions (Signed)
Medication Instructions:  Your physician recommends that you continue on your current medications as directed. Please refer to the Current Medication list given to you today.  *If you need a refill on your cardiac medications before your next appointment, please call your pharmacy*   Lab Work: NONE If you have labs (blood work) drawn today and your tests are completely normal, you will receive your results only by: MyChart Message (if you have MyChart) OR A paper copy in the mail If you have any lab test that is abnormal or we need to change your treatment, we will call you to review the results.   Testing/Procedures: [facility] home sleep study Your physician has recommended that you have a sleep study. This test records several body functions during sleep, including: brain activity, eye movement, oxygen and carbon dioxide blood levels, heart rate and rhythm, breathing rate and rhythm, the flow of air through your mouth and nose, snoring, body muscle movements, and chest and belly movement.  Follow-Up: At [facility], you and your health needs are our priority.  As part of our continuing mission to provide you with exceptional heart care, we have created designated Provider Care Teams.  These Care Teams include your primary Cardiologist (physician) and Advanced Practice Providers (APPs -  Physician Assistants and Nurse Practitioners) who all work together to provide you with the care you need, when you need it.  We recommend signing up for the patient portal called "MyChart".  Sign up information is provided on this After Visit Summary.  MyChart is used to connect with patients for Virtual Visits (Telemedicine).  Patients are able to view lab/test results, encounter notes, upcoming appointments, etc.  Non-urgent messages can be sent to your provider as well.   To learn more about what you can do with MyChart, go to [email or web address].    Your next appointment:   6 month(s)  The  format for your next appointment:   In Person  Provider:   Kristeen Miss, MD     Important Information About Sugar       Adopting a Healthy Lifestyle.   Weight: Know what a healthy weight is for you (roughly BMI <25) and aim to maintain this. You can calculate your body mass index on your smart phone  Diet: Aim for 7+ servings of fruits and vegetables daily Limit animal fats in diet for cholesterol and heart health - choose grass fed whenever available Avoid highly processed foods (fast food burgers, tacos, fried chicken, pizza, hot dogs, french fries)  Saturated fat comes in the form of butter, lard, coconut oil, margarine, partially hydrogenated oils, and fat in meat. These increase your risk of cardiovascular disease.  Use healthy plant oils, such as olive, canola, soy, corn, sunflower and peanut.  Whole foods such as fruits, vegetables and whole grains have fiber  Men need > 38 grams of fiber per day Women need > 25 grams of fiber per day  Load up on vegetables and fruits - one-half of your plate: Aim for color and variety, and remember that potatoes dont count. Go for whole grains - one-quarter of your plate: Whole wheat, barley, wheat berries, quinoa, oats, brown rice, and foods made with them. If you want pasta, go with whole wheat pasta. Protein power - one-quarter of your plate: Fish, chicken, beans, and nuts are all healthy, versatile protein sources. Limit red meat. You need carbohydrates for energy! The type of carbohydrate is more important than the amount. Choose carbohydrates such as  vegetables, fruits, whole grains, beans, and nuts in the place of white rice, white pasta, potatoes (baked or fried), macaroni and cheese, cakes, cookies, and donuts.  If youre thirsty, drink water. Coffee and tea are good in moderation, but skip sugary drinks and limit milk and dairy products to one or two daily servings. Keep sugar intake at 6 teaspoons or 24 grams or LESS        Exercise: Aim for 150 min of moderate intensity exercise weekly for heart health, and weights twice weekly for bone health Stay active - any steps are better than no steps! Aim for 7-9 hours of sleep daily

## 2021-11-24 NOTE — Progress Notes (Signed)
Cardiology Office Note:    Date:  11/24/2021   ID:  Travis Riggs, DOB [birth year], MRN [id number]  PCP:  Donato Schultz, FNP   [facility] Providers Cardiologist:  None     Referring MD: Wallis Bamberg, PA-C   Chief Complaint  Patient presents with   Palpitations    History of Present Illness:    Travis Riggs is a 31 y.o. male with a hx of DM, HTN, HLD  He was recently seen in the ER with palpitations and chest pain    Went to urgent care with head aches ,  Was found to have HTN  Started BP meds  Was diagnosed with shingles  Was very fatigued while on the BP meds.  He stopped the lisinopril  Was eating a high salt diet  Eats high carb diet also  Has been found to have pre-diabetes  Wt is 336 lbs    + snores May have some apneac episodes   Works in [employer] .  Grandfather on mother side died of MI, in his 109s  Mother - HTN, DM,    Lipids at [facility]   A1C 5.8 HDL is low  Trigs = 485 LDL =     Past Medical History:  Diagnosis Date   Diabetes (HCC)    Hyperlipidemia    Hypertension    Palpitations    Precordial pain    Viral illness     History reviewed. No pertinent surgical history.  Current Medications: No outpatient medications have been marked as taking for the 11/24/21 encounter (Office Visit) with Shelagh Rayman, Deloris Ping, MD.     Allergies:   Patient has no known allergies.   Social History   Socioeconomic History   Marital status: Single    Spouse name: Not on file   Number of children: Not on file   Years of education: Not on file   Highest education level: Not on file  Occupational History   Not on file  Tobacco Use   Smoking status: Some Days    Types: Cigars   Smokeless tobacco: Never  Vaping Use   Vaping Use: Never used  Substance and Sexual Activity   Alcohol use: Yes    Comment: once a month   Drug use: Yes    Types: Marijuana   Sexual activity: Yes  Other Topics Concern   Not on file   Social History Narrative   Not on file   Social Determinants of Health   Financial Resource Strain: Not on file  Food Insecurity: Not on file  Transportation Needs: Not on file  Physical Activity: Not on file  Stress: Not on file  Social Connections: Not on file     Family History: The patient's family history includes Diabetes in his mother; Hyperlipidemia in his mother.  ROS:   Please see the history of present illness.     All other systems reviewed and are negative.  EKGs/Labs/Other Studies Reviewed:    The following studies were reviewed today:   EKG:   Oct. 24, 2023:   NSR at 65.  No ST or T wave changes.    Recent Labs: 02/02/2021: Hemoglobin 14.7; Platelets 271 11/04/2021: ALT 88; BUN 13; Creatinine, Ser 0.79; Potassium 3.9; Sodium 138  Recent Lipid Panel No results found for: "CHOL", "TRIG", "HDL", "CHOLHDL", "VLDL", "LDLCALC", "LDLDIRECT"   Risk Assessment/Calculations:                Physical Exam:  VS:  BP 124/76   Pulse 83   Ht 6' (1.829 m)   Wt (!) 336 lb (152.4 kg)   SpO2 96%   BMI 45.57 kg/m     Wt Readings from Last 3 Encounters:  11/24/21 (!) 336 lb (152.4 kg)  11/04/21 (!) 315 lb (142.9 kg)  02/02/21 (!) 320 lb (145.2 kg)     GEN:  morbidly obese male, very pleasant  in no acute distress HEENT: Normal NECK: No JVD; No carotid bruits LYMPHATICS: No lymphadenopathy CARDIAC: RRR, no murmurs, rubs, gallops RESPIRATORY:  Clear to auscultation without rales, wheezing or rhonchi  ABDOMEN: Soft, non-tender, non-distended MUSCULOSKELETAL:  No edema; No deformity  SKIN: Warm and dry NEUROLOGIC:  Alert and oriented x 3 PSYCHIATRIC:  Normal affect   ASSESSMENT:    No diagnosis found. PLAN:    In order of problems listed above:  Hypertension: Patient has been doing better with his hypertension.  He has been cutting back salt.  His blood pressure is now normal. Will continue to monitor.  I encouraged him to work on weight loss and  improving his diet.  2.  Chest pressure: Suspect this is noncardiac chest pain.  3.  Hyperlipidemia: His triglycerides are extremely elevated.  He needs to work on weight loss.  He has morbid obesity.  He needs to tighten up on his diet.  I will see him in the office in 6 months for follow-up visit.           Medication Adjustments/Labs and Tests Ordered: Current medicines are reviewed at length with the patient today.  Concerns regarding medicines are outlined above.  No orders of the defined types were placed in this encounter.  No orders of the defined types were placed in this encounter.   There are no Patient Instructions on file for this visit.   Signed, Kristeen Miss, MD  11/24/2021 4:07 PM    [facility]

## 2021-11-24 NOTE — Telephone Encounter (Signed)
Prior Authorization for [facility] sent to [employer]

## 2022-05-24 NOTE — Progress Notes (Signed)
Cardiology Office Note:    Date:  05/24/2022   ID:  Travis Riggs, DOB [birth year], MRN [id number]  PCP:  Donato Schultz, FNP    [facility] Providers Cardiologist:  Kristeen Miss, MD     Referring MD: Donato Schultz, FNP   No chief complaint on file.   History of Present Illness:    Travis Riggs is a 32 y.o. male with a hx of DM, HTN, HLD  He was recently seen in the ER with palpitations and chest pain    Went to urgent care with head aches ,  Was found to have HTN  Started BP meds  Was diagnosed with shingles  Was very fatigued while on the BP meds.  He stopped the lisinopril  Was eating a high salt diet  Eats high carb diet also  Has been found to have pre-diabetes  Wt is 336 lbs    + snores May have some apneac episodes   Works in [employer] .  Grandfather on mother side died of MI, in his 43s  Mother - HTN, DM,    Lipids at [facility]   A1C 5.8 HDL is low  Trigs = 485 LDL =   May 25, 2022 Travis Riggs is seen today for follow up of his HTN ,HLD, morbid obesity    Past Medical History:  Diagnosis Date   Diabetes (HCC)    Hyperlipidemia    Hypertension    Palpitations    Precordial pain    Viral illness     No past surgical history on file.  Current Medications: No outpatient medications have been marked as taking for the 05/25/22 encounter (Appointment) with Sidda Humm, Deloris Ping, MD.     Allergies:   Patient has no known allergies.   Social History   Socioeconomic History   Marital status: Single    Spouse name: Not on file   Number of children: Not on file   Years of education: Not on file   Highest education level: Not on file  Occupational History   Not on file  Tobacco Use   Smoking status: Some Days    Types: Cigars   Smokeless tobacco: Never  Vaping Use   Vaping Use: Never used  Substance and Sexual Activity   Alcohol use: Yes    Comment: once a month   Drug use: Yes    Types: Marijuana   Sexual  activity: Yes  Other Topics Concern   Not on file  Social History Narrative   Not on file   Social Determinants of Health   Financial Resource Strain: Not on file  Food Insecurity: Not on file  Transportation Needs: Not on file  Physical Activity: Not on file  Stress: Not on file  Social Connections: Not on file     Family History: The patient's family history includes Diabetes in his mother; Hyperlipidemia in his mother.  ROS:   Please see the history of present illness.     All other systems reviewed and are negative.  EKGs/Labs/Other Studies Reviewed:    The following studies were reviewed today:   EKG:    Recent Labs: 11/04/2021: ALT 88; BUN 13; Creatinine, Ser 0.79; Potassium 3.9; Sodium 138  Recent Lipid Panel No results found for: "CHOL", "TRIG", "HDL", "CHOLHDL", "VLDL", "LDLCALC", "LDLDIRECT"   Risk Assessment/Calculations:         STOP-Bang Score:  6  { Consider Dx Sleep Disordered Breathing or Sleep Apnea  ICD G47.33          :  1}     Physical Exam:    Physical Exam: There were no vitals taken for this visit.  No BP recorded.  {Refresh Note OR Click here to enter BP  :1}***    GEN:  Well nourished, well developed in no acute distress HEENT: Normal NECK: No JVD; No carotid bruits LYMPHATICS: No lymphadenopathy CARDIAC: RRR ***, no murmurs, rubs, gallops RESPIRATORY:  Clear to auscultation without rales, wheezing or rhonchi  ABDOMEN: Soft, non-tender, non-distended MUSCULOSKELETAL:  No edema; No deformity  SKIN: Warm and dry NEUROLOGIC:  Alert and oriented x 3   ASSESSMENT:    No diagnosis found. PLAN:       Hypertension:    2.  Chest pressure:    3.  Hyperlipidemia:        Medication Adjustments/Labs and Tests Ordered: Current medicines are reviewed at length with the patient today.  Concerns regarding medicines are outlined above.  No orders of the defined types were placed in this encounter.  No orders of the defined  types were placed in this encounter.   There are no Patient Instructions on file for this visit.   Signed, Kristeen Miss, MD  05/24/2022 9:52 AM    [facility]

## 2022-05-25 ENCOUNTER — Ambulatory Visit: Payer: Self-pay | Admitting: Cardiovascular Disease

## 2022-11-25 IMAGING — DX DG CHEST 2V
2 series · 2 of 2 positions shown · non-contrast
Comparison: None.

CLINICAL DATA: Chest pain

EXAM:
CHEST - 2 VIEW

[chest pa]
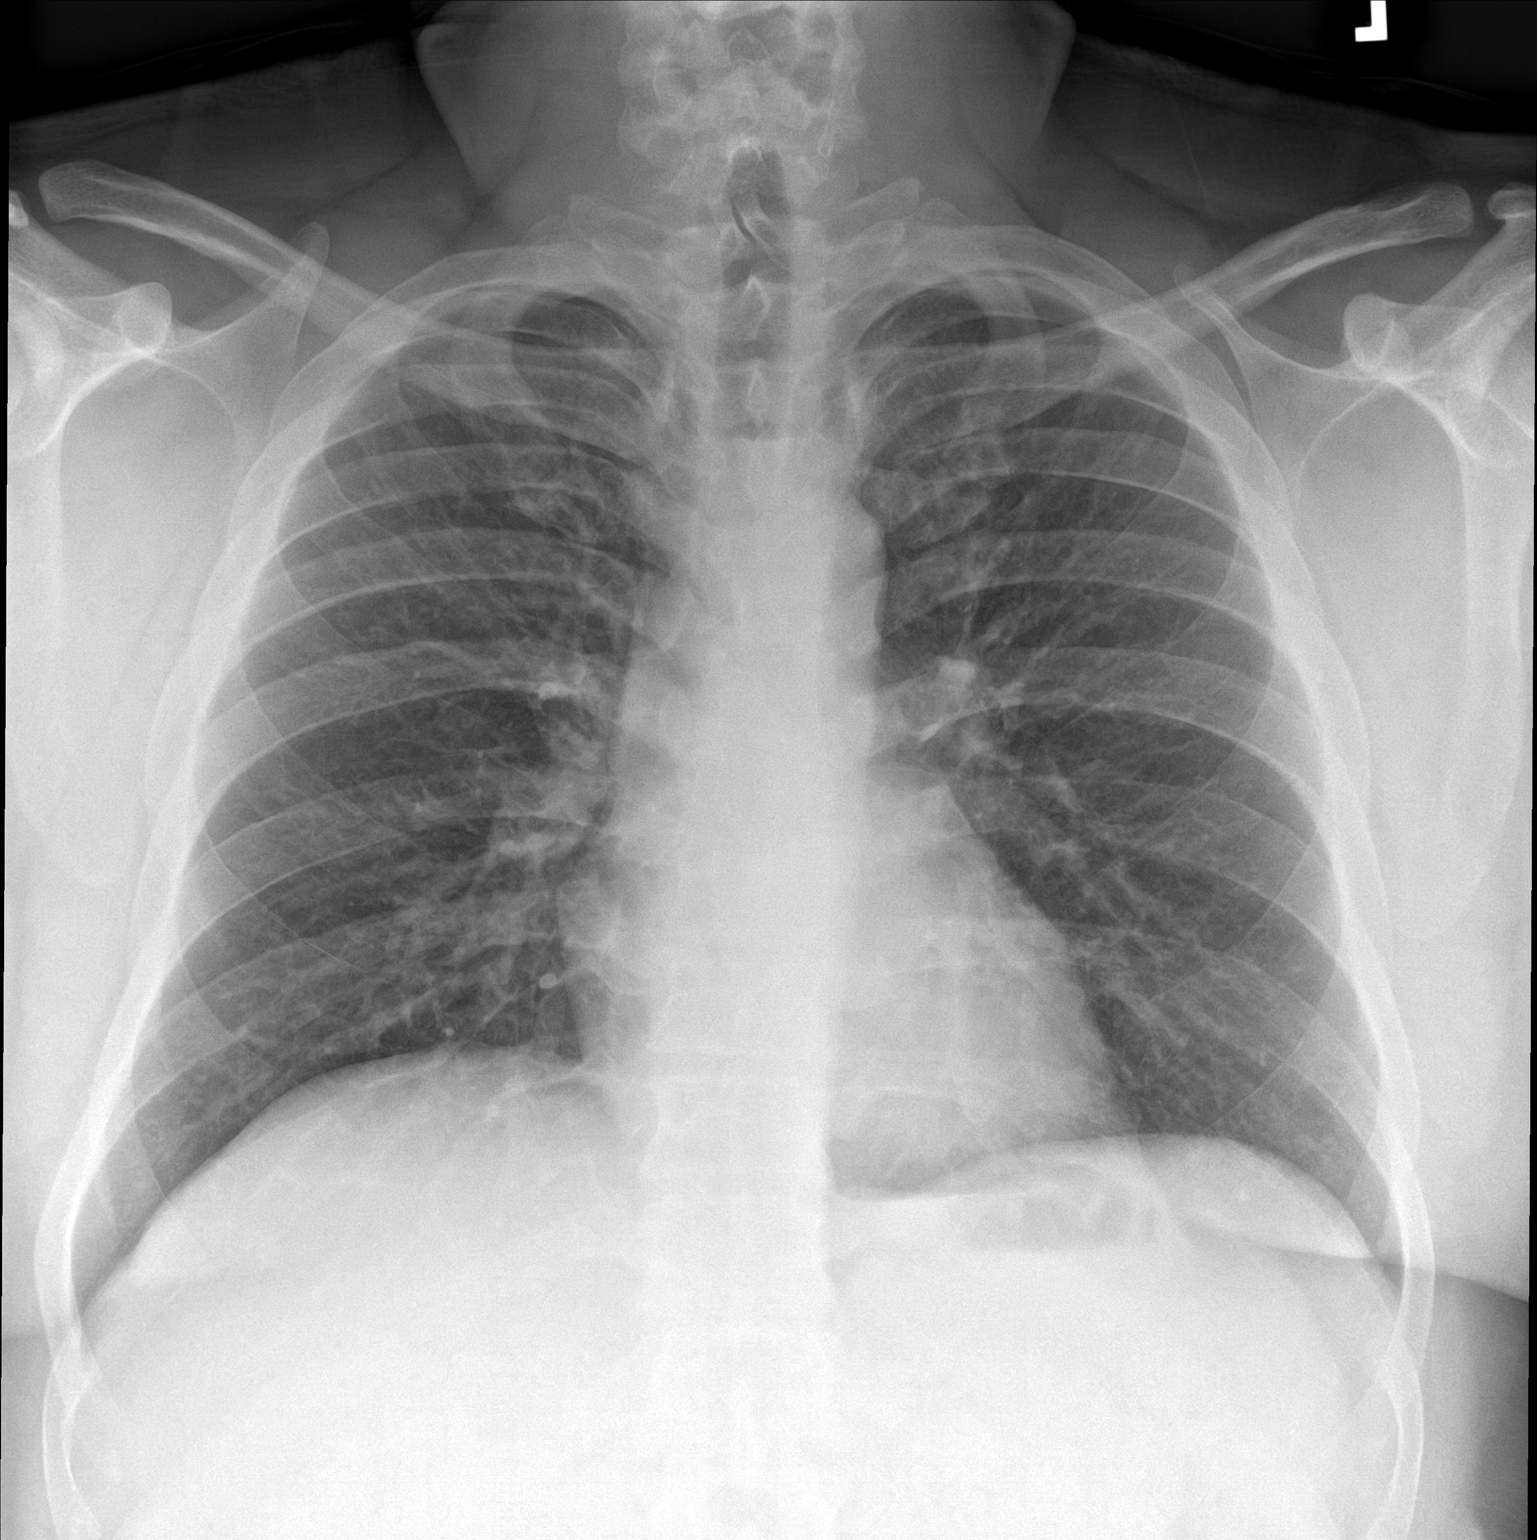

[chest lat]
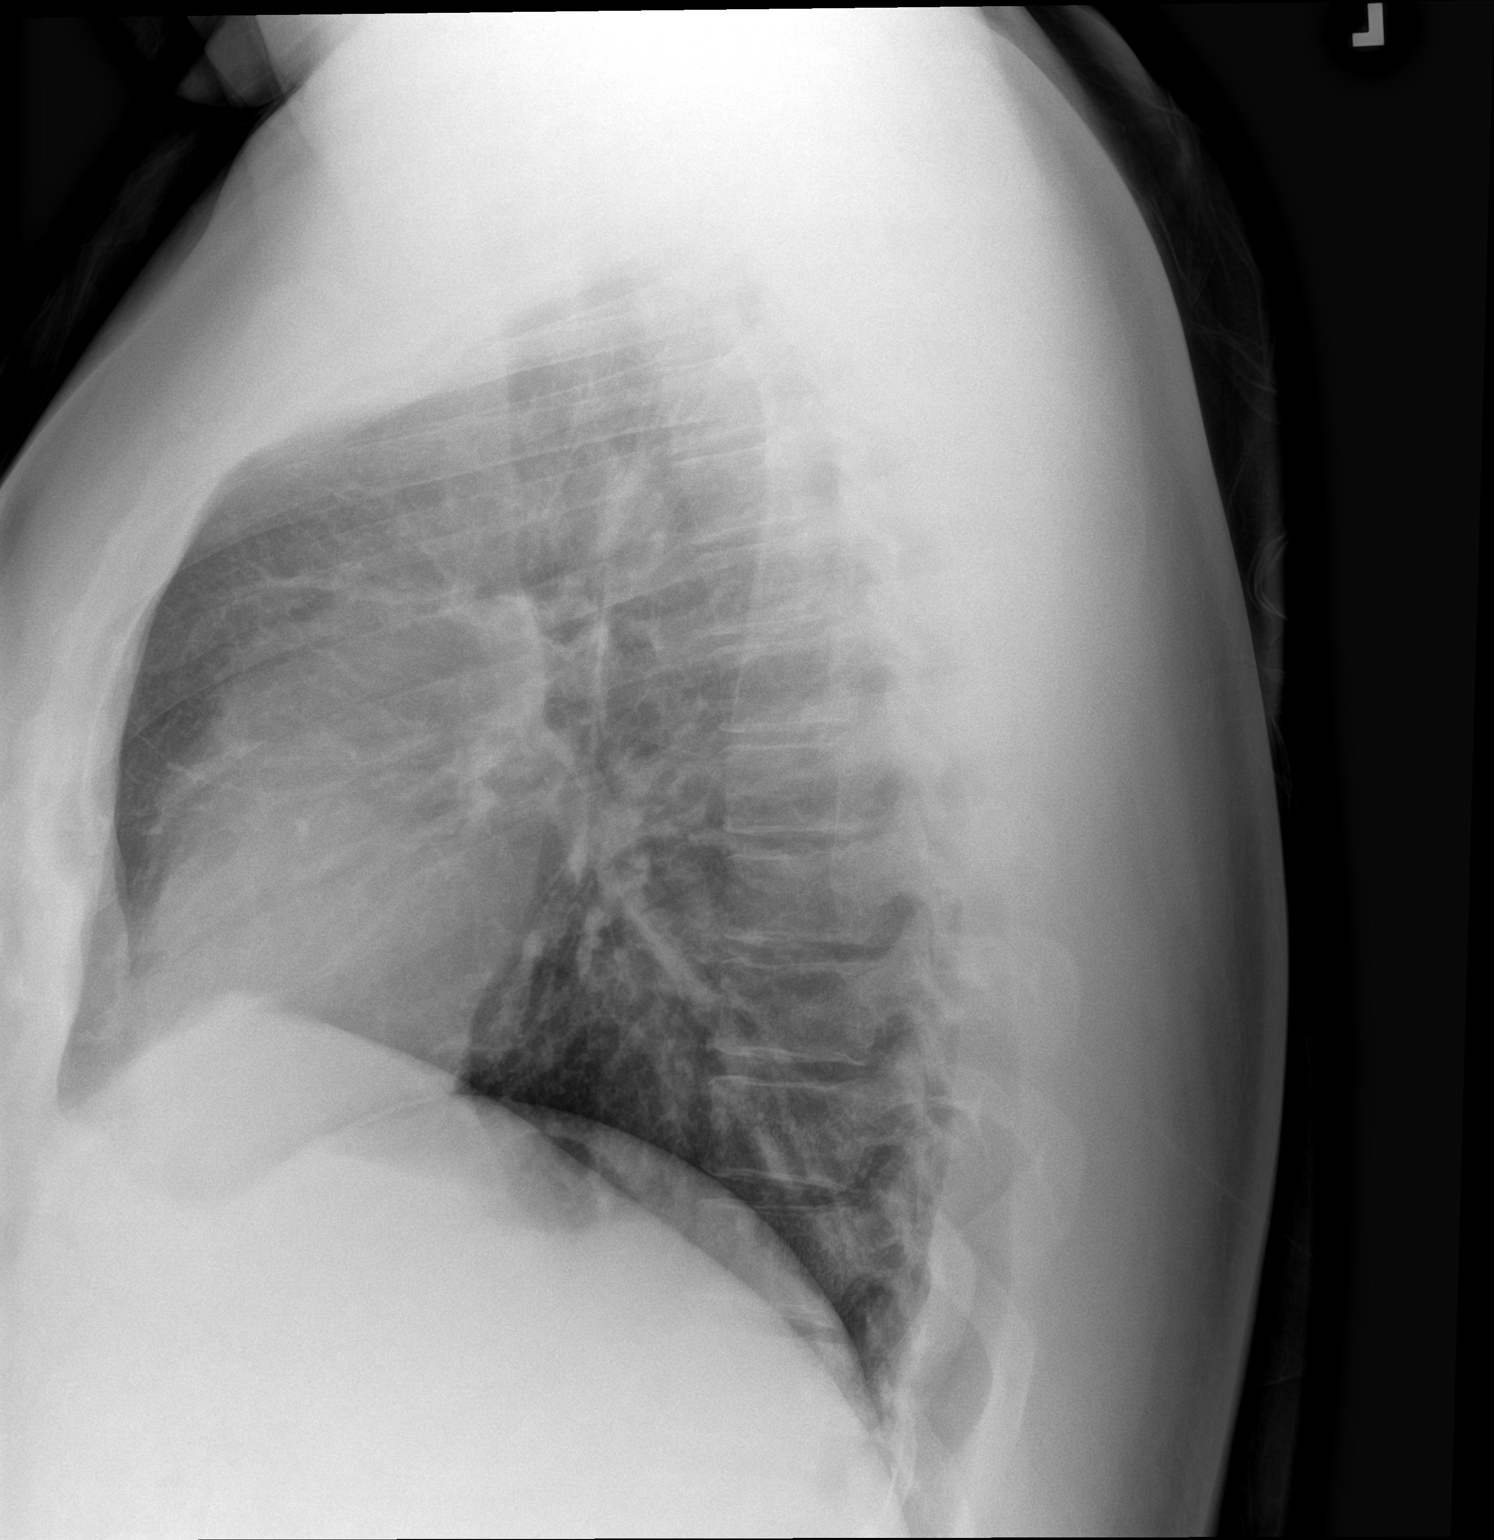

[2 of 2 positions shown; findings below may reference images not displayed]

FINDINGS: The heart size and mediastinal contours are within normal limits.
Both lungs are clear. The visualized skeletal structures are
unremarkable.
IMPRESSION: No active cardiopulmonary disease.

## 2023-03-04 ENCOUNTER — Encounter (HOSPITAL_BASED_OUTPATIENT_CLINIC_OR_DEPARTMENT_OTHER): Payer: Self-pay | Admitting: Emergency Medicine

## 2023-03-04 ENCOUNTER — Other Ambulatory Visit: Payer: Self-pay

## 2023-03-04 ENCOUNTER — Emergency Department (HOSPITAL_BASED_OUTPATIENT_CLINIC_OR_DEPARTMENT_OTHER)
Admission: EM | Admit: 2023-03-04 | Discharge: 2023-03-04 | Disposition: A | Discharge status: Home / Self Care | Payer: Self-pay | Attending: Emergency Medicine | Admitting: Emergency Medicine

## 2023-03-04 DIAGNOSIS — M5441 Lumbago with sciatica, right side: Secondary | ICD-10-CM | POA: Insufficient documentation

## 2023-03-04 MED ORDER — LIDOCAINE 5 % EX PTCH: 1.0000 | MEDICATED_PATCH | CUTANEOUS | 0 refills | Status: AC

## 2023-03-04 MED ORDER — PREDNISONE 20 MG PO TABS: ORAL_TABLET | ORAL | 0 refills | Status: AC

## 2023-03-04 NOTE — Discharge Instructions (Addendum)
 Make an appointment to follow-up with your primary care doctor.  Return to the emergency room if you have any worsening symptoms.

## 2023-03-04 NOTE — ED Notes (Signed)
 PT discharged by provider before assessment or signing up for Pt could occur.Marland KitchenMarland Kitchen

## 2023-03-04 NOTE — ED Provider Notes (Signed)
 [place] [facility] AT [facility] Provider Note   CSN: [id number] Arrival date & time: 03/04/23  4098     History  Chief Complaint  Patient presents with   Back Pain    Travis Riggs is a 33 y.o. male.  Patient is a 33 year old male who presents with back pain.  He states he was lifting a heavy toolbox about 2 weeks ago and felt a pull in his right lower back.  He says it has been hurting since that time.  The pain seems to be pretty well-controlled with Tylenol and ibuprofen but he has not been able to go back to work because it acts up.  Currently he is not having any pain.  At times it radiates to his right thigh.  He denies any numbness or weakness in his legs.  He feels that his right leg may be a little weak but it may be related to the pain as well.  Denies any fevers.  He has a history of hypertension hyperlipidemia.  He denies to me any history of diabetes although he says he has been told he is prediabetic.  He denies any loss of bowel or bladder control.       Home Medications Prior to Admission medications   Medication Sig Start Date End Date Taking? Authorizing Provider  lidocaine (LIDODERM) 5 % Place 1 patch onto the skin daily. Remove & Discard patch within 12 hours or as directed by MD 03/04/23  Yes Rolan Bucco, MD  predniSONE (DELTASONE) 20 MG tablet 3 tabs po day one, then 2 po daily x 4 days 03/04/23  Yes Rolan Bucco, MD  cetirizine (ZYRTEC) 10 MG tablet Take 1 tablet by mouth daily. 04/20/22   [provider]  fluticasone (FLONASE) 50 MCG/ACT nasal spray Place 2 sprays into both nostrils daily. 04/20/22   [provider]      Allergies    Patient has no known allergies.    Review of Systems   Review of Systems  Constitutional:  Negative for fever.  Gastrointestinal:  Negative for nausea and vomiting.  Musculoskeletal:  Positive for back pain. Negative for arthralgias, joint swelling and neck pain.  Skin:  Negative  for wound.  Neurological:  Negative for weakness, numbness and headaches.    Physical Exam Updated Vital Signs BP (!) 140/88 (BP Location: Right Arm)   Pulse 75   Temp 98.1 F (36.7 C)   Resp 18   Ht 6' (1.829 m)   Wt (!) 149.7 kg   SpO2 99%   BMI 44.76 kg/m  Physical Exam Constitutional:      Appearance: He is well-developed.  HENT:     Head: Normocephalic and atraumatic.  Cardiovascular:     Rate and Rhythm: Normal rate.  Pulmonary:     Effort: Pulmonary effort is normal.  Musculoskeletal:        General: Tenderness present.     Cervical back: Normal range of motion and neck supple.     Comments: Positive tenderness in the right upper lumbar area.  Negative straight leg raise bilaterally.  Patellar reflexes symmetric bilaterally.  He has good motor strength in the lower extremities bilaterally.  He is able to do a heel walk and toe walk without difficulty.  Pedal pulses are intact.  Skin:    General: Skin is warm and dry.  Neurological:     Mental Status: He is alert and oriented to person, place, and time.     Comments:  Motor 5 out of 5 all extremities, sensation grossly intact to light touch all extremities     ED Results / Procedures / Treatments   Labs (all labs ordered are listed, but only abnormal results are displayed) Labs Reviewed - No data to display  EKG None  Radiology No results found.  Procedures Procedures    Medications Ordered in ED Medications - No data to display  ED Course/ Medical Decision Making/ A&P                                 Medical Decision Making Risk Prescription drug management.   Patient is a 33 year old who presents with back pain after lifting a heavy toolbox.  He does not have any neurologic deficits or concerns for cauda equina.  No fevers or other red flag symptoms that would warrant emergent imaging.  No suspicion for epidural abscess or hematoma.  He is not anticoagulants.  His pain seems to be fairly  well-controlled.  Will start him on a short course of steroids.  He says that he does not have diabetes but his mom has a glucometer at home and he says he can keep a close check on his blood sugars while taking the steroids.  Also will prescribe Lidoderm patches.  Encouraged him to follow-up with his PCP.  If his symptoms are improving, he may need an MRI.  Return precautions were given.  Final Clinical Impression(s) / ED Diagnoses Final diagnoses:  Acute right-sided low back pain with right-sided sciatica    Rx / DC Orders ED Discharge Orders          Ordered    predniSONE (DELTASONE) 20 MG tablet        03/04/23 0959    lidocaine (LIDODERM) 5 %  Every 24 hours        03/04/23 0959              Rolan Bucco, MD 03/04/23 1003

## 2023-03-04 NOTE — ED Triage Notes (Signed)
 C/o lower back pain x 2 weeks. States pain started when lifting something heavy. Seen at chiropractor but no relief. Denies urinary issues. Had xrays done yesterday.

## 2023-03-04 NOTE — ED Notes (Signed)
 Discharge paperwork given and verbally understood.
